# Patient Record
Sex: Male | Born: 1980 | Race: Black or African American | Hispanic: No | Marital: Married | Smoking: Never smoker
Health system: Southern US, Community
[De-identification: ages and names within clinical notes are randomized; demographics above are authoritative.]

## PROBLEM LIST (undated history)

## (undated) DIAGNOSIS — M109 Gout, unspecified: Secondary | ICD-10-CM

## (undated) DIAGNOSIS — I10 Essential (primary) hypertension: Secondary | ICD-10-CM

## (undated) DIAGNOSIS — E119 Type 2 diabetes mellitus without complications: Secondary | ICD-10-CM

---

## 1998-09-21 ENCOUNTER — Encounter: Payer: Self-pay | Admitting: Family Medicine

## 1998-09-21 ENCOUNTER — Ambulatory Visit (HOSPITAL_COMMUNITY): Admission: RE | Admit: 1998-09-21 | Discharge: 1998-09-21 | Payer: Self-pay | Admitting: Family Medicine

## 2003-06-20 ENCOUNTER — Emergency Department (HOSPITAL_COMMUNITY): Admission: EM | Admit: 2003-06-20 | Discharge: 2003-06-20 | Payer: Self-pay

## 2003-06-20 ENCOUNTER — Encounter: Payer: Self-pay | Admitting: Emergency Medicine

## 2013-08-21 ENCOUNTER — Emergency Department: Payer: Self-pay | Admitting: Emergency Medicine

## 2013-08-21 LAB — COMPREHENSIVE METABOLIC PANEL
Albumin: 4.1 g/dL (ref 3.4–5.0)
Alkaline Phosphatase: 162 U/L — ABNORMAL HIGH
Anion Gap: 5 — ABNORMAL LOW (ref 7–16)
BUN: 9 mg/dL (ref 7–18)
Bilirubin,Total: 1.5 mg/dL — ABNORMAL HIGH (ref 0.2–1.0)
Calcium, Total: 10.3 mg/dL — ABNORMAL HIGH (ref 8.5–10.1)
Chloride: 97 mmol/L — ABNORMAL LOW (ref 98–107)
Co2: 30 mmol/L (ref 21–32)
Creatinine: 1.2 mg/dL (ref 0.60–1.30)
EGFR (African American): >60
EGFR (Non-African Amer.): >60
Glucose: 489 mg/dL — ABNORMAL HIGH (ref 65–99)
Osmolality: 285 (ref 275–301)
Potassium: 4 mmol/L (ref 3.5–5.1)
SGOT(AST): 90 U/L — ABNORMAL HIGH (ref 15–37)
SGPT (ALT): 208 U/L — ABNORMAL HIGH (ref 12–78)
Sodium: 132 mmol/L — ABNORMAL LOW (ref 136–145)
Total Protein: 8.3 g/dL — ABNORMAL HIGH (ref 6.4–8.2)

## 2013-08-21 LAB — URINALYSIS, COMPLETE
Bacteria: NONE SEEN
Bilirubin,UR: NEGATIVE
Glucose,UR: >=500 mg/dL (ref 0–75)
Leukocyte Esterase: NEGATIVE
Nitrite: NEGATIVE
Ph: 6 (ref 4.5–8.0)
Protein: NEGATIVE
Specific Gravity: 1.032 (ref 1.003–1.030)
Squamous Epithelial: NONE SEEN

## 2013-08-21 LAB — CBC
HCT: 51.2 % (ref 40.0–52.0)
HGB: 17.9 g/dL (ref 13.0–18.0)
MCH: 31.1 pg (ref 26.0–34.0)
MCHC: 34.9 g/dL (ref 32.0–36.0)
MCV: 89 fL (ref 80–100)
Platelet: 209 10*3/uL (ref 150–440)
RBC: 5.76 10*6/uL (ref 4.40–5.90)
RDW: 12.9 % (ref 11.5–14.5)
WBC: 5.6 10*3/uL (ref 3.8–10.6)

## 2016-12-21 ENCOUNTER — Emergency Department (HOSPITAL_COMMUNITY)
Admission: EM | Admit: 2016-12-21 | Discharge: 2016-12-21 | Disposition: A | Discharge status: Home / Self Care | Payer: Self-pay | Attending: Emergency Medicine | Admitting: Emergency Medicine

## 2016-12-21 ENCOUNTER — Emergency Department (HOSPITAL_COMMUNITY): Payer: Self-pay

## 2016-12-21 ENCOUNTER — Encounter (HOSPITAL_COMMUNITY): Payer: Self-pay | Admitting: *Deleted

## 2016-12-21 DIAGNOSIS — I10 Essential (primary) hypertension: Secondary | ICD-10-CM | POA: Insufficient documentation

## 2016-12-21 DIAGNOSIS — R0789 Other chest pain: Secondary | ICD-10-CM | POA: Insufficient documentation

## 2016-12-21 DIAGNOSIS — M25472 Effusion, left ankle: Secondary | ICD-10-CM | POA: Insufficient documentation

## 2016-12-21 DIAGNOSIS — E119 Type 2 diabetes mellitus without complications: Secondary | ICD-10-CM | POA: Insufficient documentation

## 2016-12-21 HISTORY — DX: Type 2 diabetes mellitus without complications: E11.9

## 2016-12-21 HISTORY — DX: Gout, unspecified: M10.9

## 2016-12-21 HISTORY — DX: Essential (primary) hypertension: I10

## 2016-12-21 LAB — BASIC METABOLIC PANEL
ANION GAP: 7 (ref 5–15)
BUN: 9 mg/dL (ref 6–20)
CO2: 28 mmol/L (ref 22–32)
Calcium: 9.6 mg/dL (ref 8.9–10.3)
Chloride: 102 mmol/L (ref 101–111)
Creatinine, Ser: 0.98 mg/dL (ref 0.61–1.24)
GFR calc Af Amer: >60 mL/min (ref >60)
Glucose, Bld: 97 mg/dL (ref 65–99)
POTASSIUM: 4.1 mmol/L (ref 3.5–5.1)
SODIUM: 137 mmol/L (ref 135–145)

## 2016-12-21 LAB — CBC
HEMATOCRIT: 45.7 % (ref 39.0–52.0)
HEMOGLOBIN: 16 g/dL (ref 13.0–17.0)
MCH: 30 pg (ref 26.0–34.0)
MCHC: 35 g/dL (ref 30.0–36.0)
MCV: 85.7 fL (ref 78.0–100.0)
Platelets: 218 10*3/uL (ref 150–400)
RBC: 5.33 MIL/uL (ref 4.22–5.81)
RDW: 12.8 % (ref 11.5–15.5)
WBC: 5.9 10*3/uL (ref 4.0–10.5)

## 2016-12-21 LAB — I-STAT TROPONIN, ED: Troponin i, poc: 0 ng/mL (ref 0.00–0.08)

## 2016-12-21 LAB — D-DIMER, QUANTITATIVE (NOT AT ARMC): D DIMER QUANT: 0.32 ug{FEU}/mL (ref 0.00–0.50)

## 2016-12-21 MED ORDER — NAPROXEN 375 MG PO TABS
375.0000 mg | ORAL_TABLET | Freq: Two times a day (BID) | ORAL | 0 refills | Status: DC
Start: 2016-12-21 — End: 2019-04-10

## 2016-12-21 MED ORDER — KETOROLAC TROMETHAMINE 30 MG/ML IJ SOLN
30.0000 mg | Freq: Once | INTRAMUSCULAR | Status: AC
  Administered 2016-12-21: 30 mg via INTRAVENOUS
  Filled 2016-12-21: qty 1

## 2016-12-21 NOTE — ED Provider Notes (Signed)
[facility]-EMERGENCY DEPT Provider Note   CSN: [id number] Arrival date & time: 12/21/16  1505     History   Chief Complaint Chief Complaint  Patient presents with  . Back Pain  . Chest Pain    HPI Eddie Baxter is a 36 y.o. male.  Patient is a 36 year old male with a history of hypertension and diabetes presenting today with sudden onset of right-sided chest and back pain that has gradually worsened as the day has gone on. The pain is worse with movement, deep breathing and bending. He woke up this morning feeling fine and symptoms started after his first delivery. He does not recall lifting anything heavy or feeling a catch in his side before the pain started.  He initially went to PCP who sent him here for further evaluation because he had an abnormal EKG. Patient does note he's had pain and swelling in his left ankle for the last 1 week which he thought was gout. He denies any immobilization, long trips, surgery or calf tenderness. No significant family history of DVT.  He denies any recent illness, cough or congestion. He does not suffer from allergies and is not a smoker.   The history is provided by the patient.  Back Pain   This is a new problem. The problem occurs constantly. The problem has been gradually worsening. The pain is associated with no known injury. The pain is present in the thoracic spine. The pain does not radiate. The pain is at a severity of 8/10. Associated symptoms include chest pain.  Chest Pain   Associated symptoms include back pain.    Past Medical History:  Diagnosis Date  . Diabetes mellitus without complication (HCC)   . Gout   . Hypertension     There are no active problems to display for this patient.   History reviewed. No pertinent surgical history.     Home Medications    Prior to Admission medications   Not on File    Family History History reviewed. No pertinent family history.  Social History Social History  Substance Use  Topics  . Smoking status: Never Smoker  . Smokeless tobacco: Not on file  . Alcohol use No     Allergies   Patient has no known allergies.   Review of Systems Review of Systems  Cardiovascular: Positive for chest pain.  Musculoskeletal: Positive for back pain.  All other systems reviewed and are negative.    Physical Exam Updated Vital Signs BP (!) 142/93 (BP Location: Right Arm)   Pulse 79   Temp 97.8 F (36.6 C) (Oral)   Resp 16   SpO2 99%   Physical Exam  Constitutional: He is oriented to person, place, and time. He appears well-developed and well-nourished. No distress.  HENT:  Head: Normocephalic and atraumatic.  Mouth/Throat: Oropharynx is clear and moist.  Eyes: Conjunctivae and EOM are normal. Pupils are equal, round, and reactive to light.  Neck: Normal range of motion. Neck supple.  Cardiovascular: Normal rate, regular rhythm and intact distal pulses.   No murmur heard. Pulmonary/Chest: Effort normal and breath sounds normal. No respiratory distress. He has no wheezes. He has no rales.     He exhibits tenderness.  Minimal tenderness with chest palpation  Abdominal: Soft. He exhibits no distension. There is no tenderness. There is no rebound and no guarding.  Musculoskeletal: Normal range of motion. He exhibits no edema or tenderness.  Mild swelling of the left ankle and warmth but no erythema  Neurological: He is alert and oriented to person, place, and time.  Skin: Skin is warm and dry. No rash noted. No erythema.  Psychiatric: He has a normal mood and affect. His behavior is normal.  Nursing note and vitals reviewed.    ED Treatments / Results  Labs (all labs ordered are listed, but only abnormal results are displayed) Labs Reviewed  BASIC METABOLIC PANEL  CBC  D-DIMER, QUANTITATIVE (NOT AT [facility])  I-STAT TROPOININ, ED    EKG  EKG Interpretation  Date/Time:  Friday December 21 2016 15:19:15 EDT Ventricular Rate:  87 PR Interval:  154 QRS  Duration: 86 QT Interval:  366 QTC Calculation: 440 R Axis:   42 Text Interpretation:  Normal sinus rhythm Nonspecific T wave abnormality No previous tracing Confirmed by Anitra Lauth  MD, Briauna Gilmartin ([id number]) on 12/21/2016 7:03:56 PM       Radiology Dg Chest 2 View  Result Date: 12/21/2016 CLINICAL DATA:  Back pain for 1 day EXAM: CHEST  2 VIEW COMPARISON:  None. FINDINGS: The heart size and mediastinal contours are within normal limits. Both lungs are clear. The visualized skeletal structures are unremarkable. IMPRESSION: No active cardiopulmonary disease. Electronically Signed   By: Alcide Clever M.D.   On: 12/21/2016 15:55    Procedures Procedures (including critical care time)  Medications Ordered in ED Medications  ketorolac (TORADOL) 30 MG/ML injection 30 mg (not administered)     Initial Impression / Assessment and Plan / ED Course  I have reviewed the triage vital signs and the nursing notes.  Pertinent labs & imaging results that were available during my care of the patient were reviewed by me and considered in my medical decision making (see chart for details).     Patient presenting today with pleuritic type chest pain started this morning and has gradually worsened. Initially saw PCP and was sent here for further evaluation.  Patient does not have a rash concerning for shingles at this time however could be early zoster that has not developed a rash yet. Patient is low risk on the Wells criteria but has had some swelling in his left ankle which is most likely gout but will do a d-dimer. Chest x-ray within normal limits without any evidence of pneumothorax. He has no infectious symptoms concerning for pneumonia.  EKG has nonspecific abnormalities such as T-wave inversion but may be related to chronic hypertension.  Troponin is within normal limits and patient has had pain for greater than 6 hours and story is very atypical for ACS. Low suspicion for dissection and patient denies any  abdominal issues. Could be musculoskeletal. It is worse with palpation and movement. 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 CBC, BMP and troponin are negative. D-dimer pending and patient given Toradol  8:27 PM Dimer is neg will treat for MSK pain.  F/u with PCP if not improved in a week or rash develops  Final Clinical Impressions(s) / ED Diagnoses   Final diagnoses:  Chest wall pain    New Prescriptions New Prescriptions   NAPROXEN (NAPROSYN) 375 MG TABLET    Take 1 tablet (375 mg total) by mouth 2 (two) times daily.     Gwyneth Sprout, MD 12/21/16 [phone]

## 2016-12-21 NOTE — ED Triage Notes (Signed)
Pt reports being sent here from pcp office for right side back pain that radiates around to his front. Pain increases with movement and deep breathing. Denies recent cough. pcp did EKG that she reported was abnormal, sent here for cardiac workup. No acute distress is noted at triage.

## 2018-04-08 IMAGING — CR DG CHEST 2V
2 series · 2 of 2 positions shown · non-contrast
Comparison: None.

CLINICAL DATA: Back pain for 1 day

EXAM:
CHEST  2 VIEW

[chest pa]
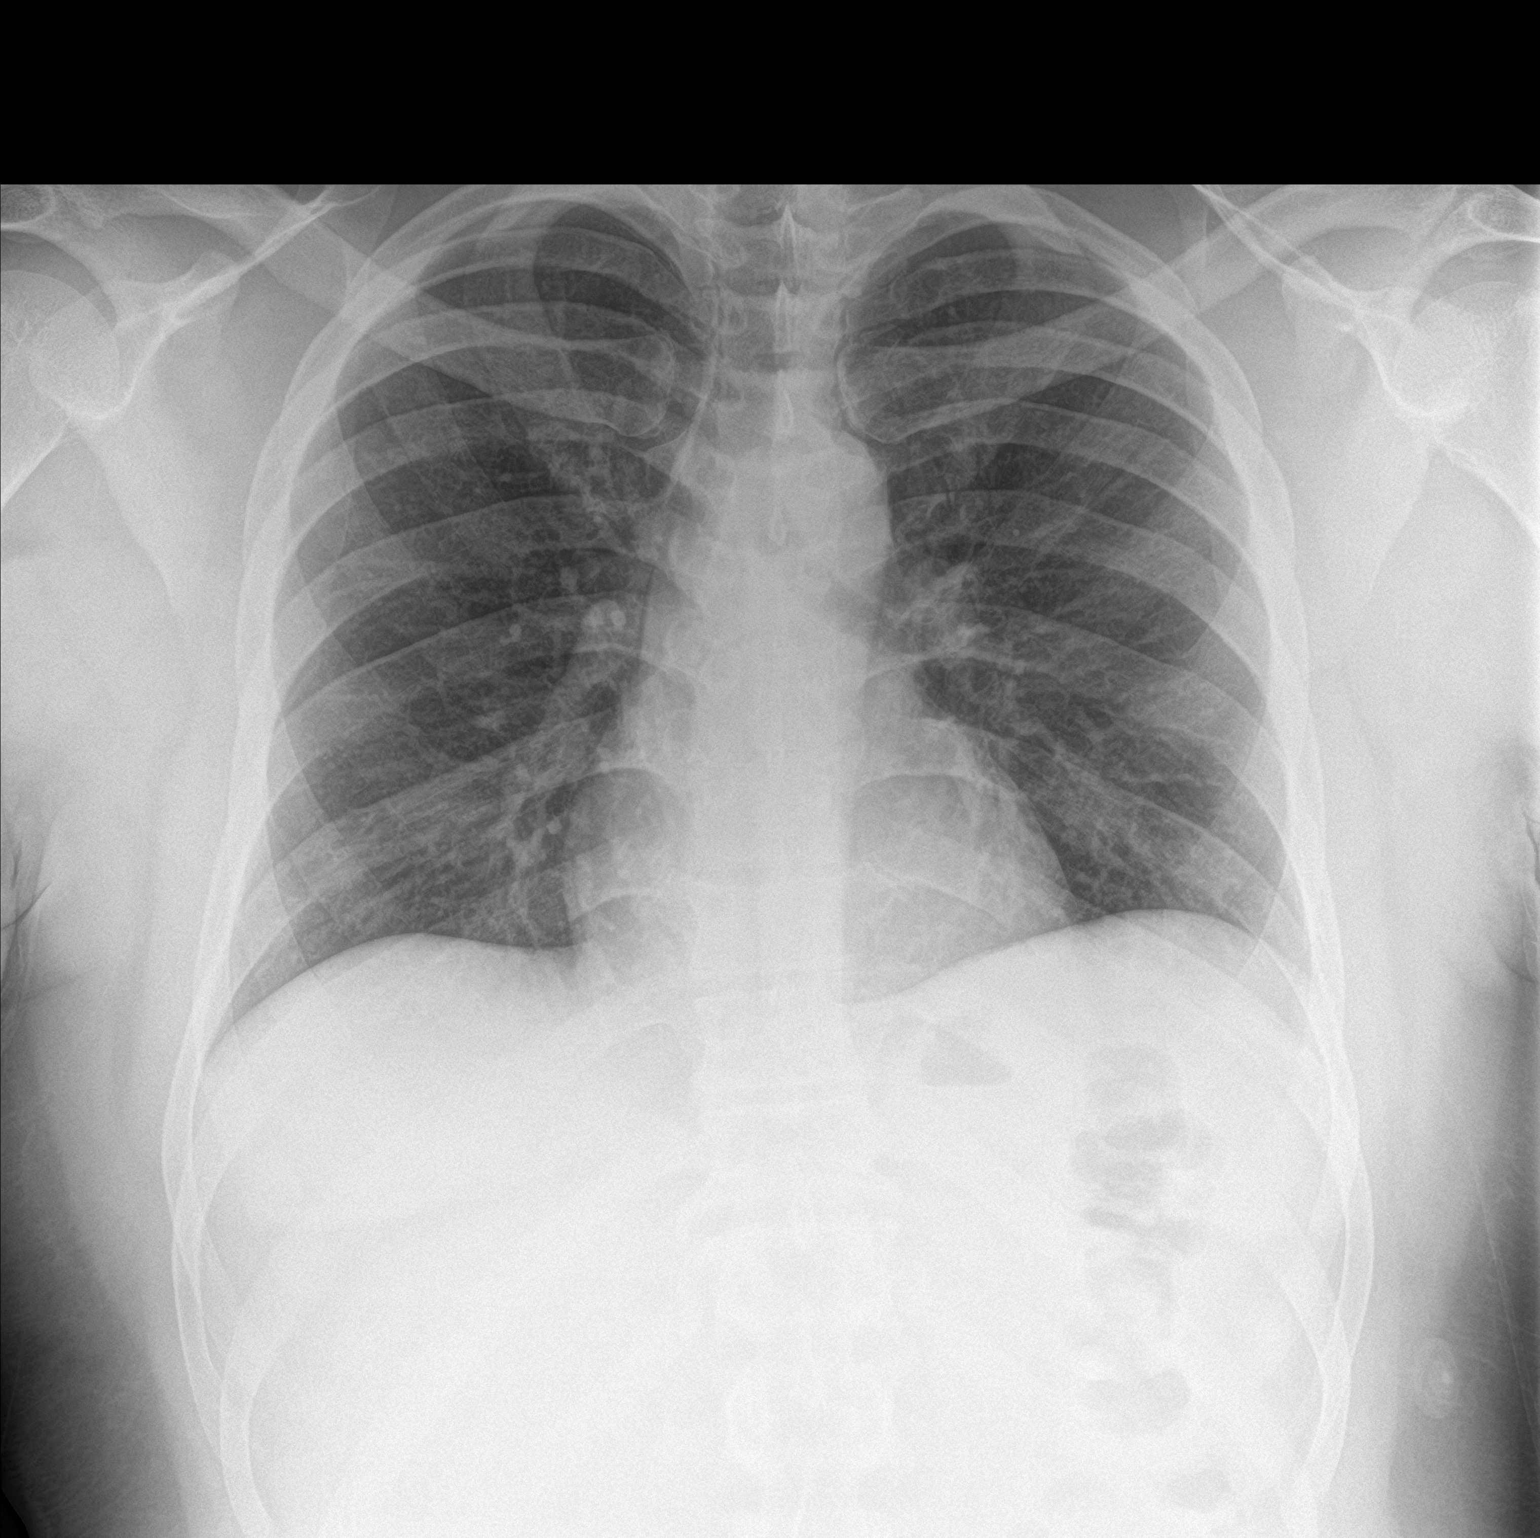

[chest lat]
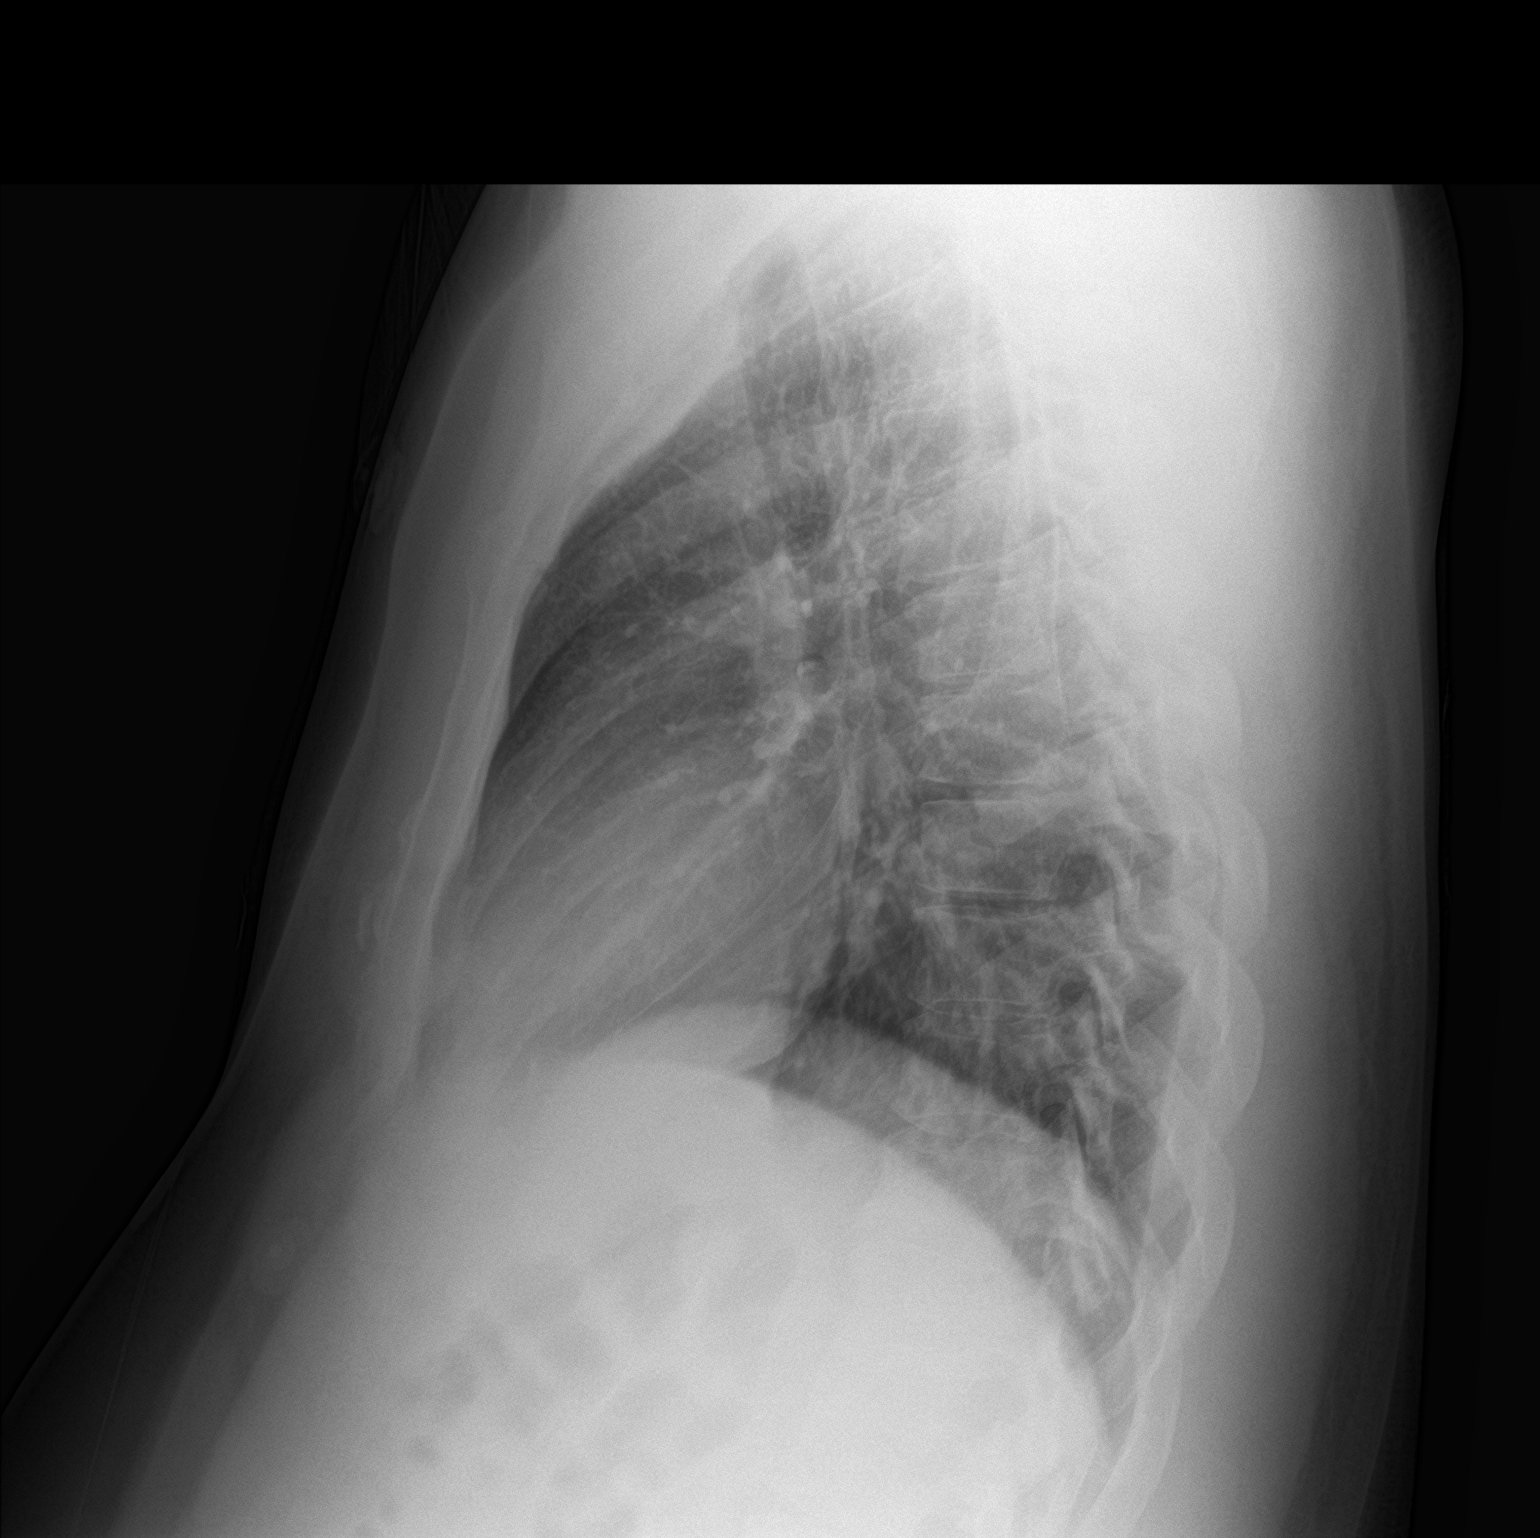

[2 of 2 positions shown; findings below may reference images not displayed]

FINDINGS: The heart size and mediastinal contours are within normal limits.
Both lungs are clear. The visualized skeletal structures are
unremarkable.
IMPRESSION: No active cardiopulmonary disease.

## 2019-03-09 ENCOUNTER — Emergency Department (HOSPITAL_COMMUNITY)
Admission: EM | Admit: 2019-03-09 | Discharge: 2019-03-09 | Discharge status: Against Medical Advice | Payer: BC Managed Care – PPO

## 2019-03-09 ENCOUNTER — Encounter (HOSPITAL_COMMUNITY): Payer: Self-pay | Admitting: *Deleted

## 2019-03-09 ENCOUNTER — Emergency Department (HOSPITAL_COMMUNITY)
Admission: EM | Admit: 2019-03-09 | Discharge: 2019-03-10 | Disposition: A | Discharge status: Home / Self Care | Payer: BC Managed Care – PPO | Attending: Emergency Medicine | Admitting: Emergency Medicine

## 2019-03-09 ENCOUNTER — Emergency Department (HOSPITAL_COMMUNITY): Payer: BC Managed Care – PPO

## 2019-03-09 DIAGNOSIS — J029 Acute pharyngitis, unspecified: Secondary | ICD-10-CM | POA: Diagnosis not present

## 2019-03-09 DIAGNOSIS — E119 Type 2 diabetes mellitus without complications: Secondary | ICD-10-CM | POA: Diagnosis not present

## 2019-03-09 DIAGNOSIS — I10 Essential (primary) hypertension: Secondary | ICD-10-CM | POA: Diagnosis not present

## 2019-03-09 DIAGNOSIS — Z20822 Contact with and (suspected) exposure to covid-19: Secondary | ICD-10-CM

## 2019-03-09 DIAGNOSIS — R0602 Shortness of breath: Secondary | ICD-10-CM | POA: Insufficient documentation

## 2019-03-09 DIAGNOSIS — Z20828 Contact with and (suspected) exposure to other viral communicable diseases: Secondary | ICD-10-CM | POA: Diagnosis not present

## 2019-03-09 NOTE — ED Notes (Signed)
No answer for triage.

## 2019-03-09 NOTE — ED Notes (Signed)
Called x5 for triage no reply.

## 2019-03-09 NOTE — ED Triage Notes (Signed)
To ED for further eval of sob and sore throat. States he was seen at [facility] today and sent here for tachycardia. No cp. Tested for strep at [facility] and sent her for covid test. Told at [facility] his strep test was neg.

## 2019-03-10 ENCOUNTER — Other Ambulatory Visit: Payer: Self-pay

## 2019-03-10 MED ORDER — ACETAMINOPHEN 500 MG PO TABS
1000.0000 mg | ORAL_TABLET | Freq: Once | ORAL | Status: AC
  Administered 2019-03-10: 01:00:00 1000 mg via ORAL
  Filled 2019-03-10: qty 2

## 2019-03-10 NOTE — ED Provider Notes (Signed)
[facility] EMERGENCY DEPARTMENT Provider Note  CSN: [id number] Arrival date & time: 03/09/19 1756  Chief Complaint(s) Shortness of Breath and Sore Throat  HPI Eddie Baxter is a 38 y.o. male   The history is provided by the patient.  Shortness of Breath Severity:  Mild Onset quality:  Gradual Duration:  1 day Timing:  Intermittent Progression:  Waxing and waning Chronicity:  New Context: URI   Relieved by:  Nothing Worsened by:  Coughing Associated symptoms: cough   Associated symptoms: no fever, no sputum production, no vomiting and no wheezing   Sore Throat Associated symptoms include shortness of breath.   UCC rapid strep negative. Sent here for further eval of possible COVID. No known exposures per patient.  Past Medical History Past Medical History:  Diagnosis Date  . Diabetes mellitus without complication (HCC)    diet controlled  . Gout   . Hypertension    There are no active problems to display for this patient.  Home Medication(s) Prior to Admission medications   Medication Sig Start Date End Date Taking? Authorizing Provider  naproxen (NAPROSYN) 375 MG tablet Take 1 tablet (375 mg total) by mouth 2 (two) times daily. 12/21/16   Blanchie Dessert, MD                                                                                                                                    Past Surgical History History reviewed. No pertinent surgical history. Family History No family history on file.  Social History Social History   Tobacco Use  . Smoking status: Never Smoker  Substance Use Topics  . Alcohol use: No  . Drug use: Not on file   Allergies Patient has no known allergies.  Review of Systems Review of Systems  Constitutional: Negative for fever.  Respiratory: Positive for cough and shortness of breath. Negative for sputum production and wheezing.   Gastrointestinal: Negative for vomiting.   All other systems are  reviewed and are negative for acute change except as noted in the HPI  Physical Exam Vital Signs  I have reviewed the triage vital signs BP (!) 155/117 (BP Location: Left Arm)   Pulse (!) 104   Temp 99.2 F (37.3 C) (Oral)   Resp 18   SpO2 99%   Physical Exam Vitals signs reviewed.  Constitutional:      General: He is not in acute distress.    Appearance: He is well-developed. He is not diaphoretic.  HENT:     Head: Normocephalic and atraumatic.     Nose: Nose normal.     Mouth/Throat:     Pharynx: Posterior oropharyngeal erythema present. No oropharyngeal exudate.     Tonsils: No tonsillar exudate.     Comments: Post nasal drip Eyes:     General: No scleral icterus.       Right eye: No discharge.  Left eye: No discharge.     Conjunctiva/sclera: Conjunctivae normal.     Pupils: Pupils are equal, round, and reactive to light.  Neck:     Musculoskeletal: Normal range of motion and neck supple.  Cardiovascular:     Rate and Rhythm: Normal rate and regular rhythm.     Heart sounds: No murmur. No friction rub. No gallop.   Pulmonary:     Effort: Pulmonary effort is normal. No respiratory distress.     Breath sounds: Normal breath sounds. No stridor. No rales.  Abdominal:     General: There is no distension.     Palpations: Abdomen is soft.     Tenderness: There is no abdominal tenderness.  Musculoskeletal:        General: No tenderness.  Skin:    General: Skin is warm and dry.     Findings: No erythema or rash.  Neurological:     Mental Status: He is alert and oriented to person, place, and time.     ED Results and Treatments Labs (all labs ordered are listed, but only abnormal results are displayed) Labs Reviewed  NOVEL CORONAVIRUS, NAA [facility](HOSPITAL ORDER, SEND-OUT TO REF LAB)                                                                                                                         EKG  EKG Interpretation  Date/Time:  Monday March 09 2019  18:11:52 EDT Ventricular Rate:  107 PR Interval:  154 QRS Duration: 90 QT Interval:  334 QTC Calculation: 445 R Axis:   73 Text Interpretation:  Sinus tachycardia Inferior infarct , age undetermined ST & T wave abnormality, consider anterolateral ischemia Abnormal ECG NO STEMI. Confirmed by Drema Pryardama,  [phone]([id number]) on 03/10/2019 12:18:46 AM      Radiology Dg Chest 2 View  Result Date: 03/09/2019 CLINICAL DATA:  SOB, tachycardia, sore throat starting this morning. No fever. Hx of diabetes, hypertension. Nonsmoker. EXAM: CHEST - 2 VIEW COMPARISON:  Chest x-ray dated 12/21/2016. FINDINGS: The heart size and mediastinal contours are within normal limits. Both lungs are clear. The visualized skeletal structures are unremarkable. IMPRESSION: No active cardiopulmonary disease. No evidence of pneumonia or pulmonary edema. Electronically Signed   By: Bary RichardStan  Maynard M.D.   On: 03/09/2019 19:24    Pertinent labs & imaging results that were available during my care of the patient were reviewed by me and considered in my medical decision making (see chart for details).  Medications Ordered in ED Medications  acetaminophen (TYLENOL) tablet 1,000 mg (has no administration in time range)  Procedures Procedures  (including critical care time)  Medical Decision Making / ED Course I have reviewed the nursing notes for this encounter and the patient's prior records (if available in EHR or on provided paperwork).   Eddie Baxter was evaluated in Emergency Department on 03/10/2019 for the symptoms described in the history of present illness. He was evaluated in the context of the global COVID-19 pandemic, which necessitated consideration that the patient might be at risk for infection with the SARS-CoV-2 virus that causes COVID-19. Institutional protocols and algorithms that  pertain to the evaluation of patients at risk for COVID-19 are in a state of rapid change based on information released by regulatory bodies including the CDC and federal and state organizations. These policies and algorithms were followed during the patient's care in the ED.  38 y.o. male presents with viral symptoms for 1 day. adequate oral hydration. Rest of history as above.  Patient appears well. No signs of toxicity, patient is interactive. No hypoxia, tachypnea or other signs of respiratory distress. No sign of clinical dehydration. Lung exam clear. Rest of exam as above.  Most consistent with viral illness   No evidence suggestive of pharyngitis, AOM, PNA, or meningitis.  Chest x-ray without evidence suggestive of pneumonia, pneumothorax, pneumomediastinum.  No abnormal contour of the mediastinum to suggest dissection. No evidence of acute injuries. EKG nonischemic.  Patient evaluated, tested and sent home with instructions for home care and Quarantine. Instructed to seek further care if symptoms worsen.  Is set up with follow up for a virtual visit with PCP in next 24-48 hours.  Discussed symptomatic treatment with the patient and they will follow closely with their PCP.        Final Clinical Impression(s) / ED Diagnoses Final diagnoses:  Sore throat  Shortness of breath  Suspected Covid-19 Virus Infection     The patient appears reasonably screened and/or stabilized for discharge and I doubt any other medical condition or other [facility] requiring further screening, evaluation, or treatment in the ED at this time prior to discharge.  Disposition: Discharge  Condition: Good  I have discussed the results, Dx and Tx plan with the patient who expressed understanding and agree(s) with the plan. Discharge instructions discussed at great length. The patient was given strict return precautions who verbalized understanding of the instructions. No further questions at time of discharge.     ED Discharge Orders    None        This chart was dictated using voice recognition software.  Despite best efforts to proofread,  errors can occur which can change the documentation meaning.   Nira Connardama,  Eduardo, MD 03/10/19 [phone]

## 2019-03-10 NOTE — Discharge Instructions (Addendum)
You may take over-the-counter medicine for symptomatic relief, such as Tylenol,TheraFlu, Alka seltzer , black elderberry, etc. Please limit acetaminophen (Tylenol) to 4000 mg and Ibuprofen (Motrin, Advil, etc.) to 2400 mg for a 24hr period. Please note that other over-the-counter medicine may contain acetaminophen or ibuprofen as a component of their ingredients.

## 2019-03-13 LAB — NOVEL CORONAVIRUS, NAA (HOSP ORDER, SEND-OUT TO REF LAB; TAT 18-24 HRS): SARS-CoV-2, NAA: NOT DETECTED

## 2019-04-10 ENCOUNTER — Other Ambulatory Visit: Payer: Self-pay

## 2019-04-10 ENCOUNTER — Emergency Department
Admission: EM | Admit: 2019-04-10 | Discharge: 2019-04-10 | Disposition: A | Discharge status: Home / Self Care | Payer: BC Managed Care – PPO | Attending: Emergency Medicine | Admitting: Emergency Medicine

## 2019-04-10 DIAGNOSIS — Z7984 Long term (current) use of oral hypoglycemic drugs: Secondary | ICD-10-CM | POA: Diagnosis not present

## 2019-04-10 DIAGNOSIS — I10 Essential (primary) hypertension: Secondary | ICD-10-CM | POA: Diagnosis not present

## 2019-04-10 DIAGNOSIS — R944 Abnormal results of kidney function studies: Secondary | ICD-10-CM | POA: Diagnosis present

## 2019-04-10 DIAGNOSIS — E119 Type 2 diabetes mellitus without complications: Secondary | ICD-10-CM | POA: Diagnosis not present

## 2019-04-10 DIAGNOSIS — N289 Disorder of kidney and ureter, unspecified: Secondary | ICD-10-CM | POA: Insufficient documentation

## 2019-04-10 LAB — BASIC METABOLIC PANEL
Anion gap: 9 (ref 5–15)
BUN: 58 mg/dL — ABNORMAL HIGH (ref 6–20)
CO2: 29 mmol/L (ref 22–32)
Calcium: 8.9 mg/dL (ref 8.9–10.3)
Chloride: 100 mmol/L (ref 98–111)
Creatinine, Ser: 2.3 mg/dL — ABNORMAL HIGH (ref 0.61–1.24)
GFR calc Af Amer: 40 mL/min — ABNORMAL LOW (ref >60)
GFR calc non Af Amer: 34 mL/min — ABNORMAL LOW (ref >60)
Glucose, Bld: 156 mg/dL — ABNORMAL HIGH (ref 70–99)
Potassium: 3.7 mmol/L (ref 3.5–5.1)
Sodium: 138 mmol/L (ref 135–145)

## 2019-04-10 LAB — COMPREHENSIVE METABOLIC PANEL
ALT: 118 U/L — ABNORMAL HIGH (ref 0–44)
AST: 60 U/L — ABNORMAL HIGH (ref 15–41)
Albumin: 4.1 g/dL (ref 3.5–5.0)
Alkaline Phosphatase: 86 U/L (ref 38–126)
Anion gap: 13 (ref 5–15)
BUN: 63 mg/dL — ABNORMAL HIGH (ref 6–20)
CO2: 25 mmol/L (ref 22–32)
Calcium: 9.4 mg/dL (ref 8.9–10.3)
Chloride: 98 mmol/L (ref 98–111)
Creatinine, Ser: 2.46 mg/dL — ABNORMAL HIGH (ref 0.61–1.24)
GFR calc Af Amer: 37 mL/min — ABNORMAL LOW (ref >60)
GFR calc non Af Amer: 32 mL/min — ABNORMAL LOW (ref >60)
Glucose, Bld: 163 mg/dL — ABNORMAL HIGH (ref 70–99)
Potassium: 3.1 mmol/L — ABNORMAL LOW (ref 3.5–5.1)
Sodium: 136 mmol/L (ref 135–145)
Total Bilirubin: 1.2 mg/dL (ref 0.3–1.2)
Total Protein: 7.9 g/dL (ref 6.5–8.1)

## 2019-04-10 LAB — CBC
HCT: 43.9 % (ref 39.0–52.0)
Hemoglobin: 15.5 g/dL (ref 13.0–17.0)
MCH: 30.1 pg (ref 26.0–34.0)
MCHC: 35.3 g/dL (ref 30.0–36.0)
MCV: 85.2 fL (ref 80.0–100.0)
Platelets: 199 10*3/uL (ref 150–400)
RBC: 5.15 MIL/uL (ref 4.22–5.81)
RDW: 11.6 % (ref 11.5–15.5)
WBC: 5.3 10*3/uL (ref 4.0–10.5)
nRBC: 0 % (ref 0.0–0.2)

## 2019-04-10 LAB — GLUCOSE, CAPILLARY: Glucose-Capillary: 164 mg/dL — ABNORMAL HIGH (ref 70–99)

## 2019-04-10 MED ORDER — METOCLOPRAMIDE HCL 10 MG PO TABS: 10.0000 mg | ORAL_TABLET | Freq: Three times a day (TID) | ORAL | 1 refills | Status: AC | PRN

## 2019-04-10 MED ORDER — ONDANSETRON HCL 4 MG/2ML IJ SOLN
4.0000 mg | Freq: Once | INTRAMUSCULAR | Status: AC
  Administered 2019-04-10: 4 mg via INTRAVENOUS
  Filled 2019-04-10: qty 2

## 2019-04-10 MED ORDER — SODIUM CHLORIDE 0.9 % IV SOLN
Freq: Once | INTRAVENOUS | Status: AC
  Administered 2019-04-10: 14:00:00 via INTRAVENOUS

## 2019-04-10 NOTE — ED Notes (Signed)
Pt a&o x4 and NAD noted. Pt states throwing up x2 weeks after going on metformin. Went to PCP today to check lab levels and was sent here because his kidneys are failing. No fevers, n&v, or other symptoms noted.

## 2019-04-10 NOTE — ED Triage Notes (Addendum)
Reports elevated BUN and creatinine at PCP yesterday, told to come to ER. Elevated glucose over last month and was recently started back on his diabetes regimen which he was off of for the last year. Denies NVD. Pt alert and oriented X4, active, cooperative, pt in NAD. RR even and unlabored, color WNL.    BUN 65, creatinine over 3 at PCP yesterday. Normal BUN and creatinine 03/25/19

## 2019-04-10 NOTE — ED Provider Notes (Signed)
[facility] Emergency Department Provider Note       Time seen: ----------------------------------------- 11:55 AM on 04/10/2019 -----------------------------------------   I have reviewed the triage vital signs and the nursing notes.  HISTORY   Chief Complaint Abnormal Lab    HPI Eddie Baxter is a 38 y.o. male with a history of diabetes, gout and hypertension who presents to the ED for abnormal renal function test.  Patient reports elevated BUN and creatinine at his primary care doctor yesterday.  He has had elevated blood sugar over the last month and recently started back on his diabetes medication after being off of them for the past year.  He has had vomiting every other day for some time.  Patient states he feels like the pressure builds up in his epigastrium when he vomits up fluids.  Past Medical History:  Diagnosis Date  . Diabetes mellitus without complication (HCC)    diet controlled  . Gout   . Hypertension     There are no active problems to display for this patient.   History reviewed. No pertinent surgical history.  Allergies Patient has no known allergies.  Social History Social History   Tobacco Use  . Smoking status: Never Smoker  Substance Use Topics  . Alcohol use: No  . Drug use: Not on file   Review of Systems Constitutional: Negative for fever. Cardiovascular: Negative for chest pain. Respiratory: Negative for shortness of breath. Gastrointestinal: Negative for abdominal pain, positive for vomiting Musculoskeletal: Negative for back pain. Skin: Negative for rash. Neurological: Negative for headaches, focal weakness or numbness.  All systems negative/normal/unremarkable except as stated in the HPI  ____________________________________________   PHYSICAL EXAM:  VITAL SIGNS: ED Triage Vitals [04/10/19 1103]  Enc Vitals Group     BP (!) 146/81     Pulse Rate (!) 104     Resp 18     Temp 98.3 F (36.8  C)     Temp Source Oral     SpO2 97 %     Weight 280 lb (127 kg)     Height 6\' 3"  (1.905 m)     Head Circumference      Peak Flow      Pain Score 0     Pain Loc      Pain Edu?      Excl. in [place]?    Constitutional: Alert and oriented. Well appearing and in no distress. Eyes: Conjunctivae are normal. Normal extraocular movements. Cardiovascular: Normal rate, regular rhythm. No murmurs, rubs, or gallops. Respiratory: Normal respiratory effort without tachypnea nor retractions. Breath sounds are clear and equal bilaterally. No wheezes/rales/rhonchi. Gastrointestinal: Soft and nontender. Normal bowel sounds Musculoskeletal: Nontender with normal range of motion in extremities. No lower extremity tenderness nor edema. Neurologic:  Normal speech and language. No gross focal neurologic deficits are appreciated.  Skin:  Skin is warm, dry and intact. No rash noted. Psychiatric: Mood and affect are normal. Speech and behavior are normal.  ___________________________________________  ED COURSE:  As part of my medical decision making, I reviewed the following data within the [place] History obtained from family if available, nursing notes, old chart and ekg, as well as notes from prior ED visits. Patient presented for abnormal renal function tests and vomiting, we will assess with labs and imaging as indicated at this time.   Procedures  ZYIER DYKEMA was evaluated in Emergency Department on 04/10/2019 for the symptoms described in the history of present  illness. He was evaluated in the context of the global COVID-19 pandemic, which necessitated consideration that the patient might be at risk for infection with the SARS-CoV-2 virus that causes COVID-19. Institutional protocols and algorithms that pertain to the evaluation of patients at risk for COVID-19 are in a state of rapid change based on information released by regulatory bodies including the CDC and federal and state  organizations. These policies and algorithms were followed during the patient's care in the ED.  ____________________________________________   LABS (pertinent positives/negatives)  Labs Reviewed  GLUCOSE, CAPILLARY - Abnormal; Notable for the following components:      Result Value   Glucose-Capillary 164 (*)    All other components within normal limits  COMPREHENSIVE METABOLIC PANEL - Abnormal; Notable for the following components:   Potassium 3.1 (*)    Glucose, Bld 163 (*)    BUN 63 (*)    Creatinine, Ser 2.46 (*)    AST 60 (*)    ALT 118 (*)    GFR calc non Af Amer 32 (*)    GFR calc Af Amer 37 (*)    All other components within normal limits  BASIC METABOLIC PANEL - Abnormal; Notable for the following components:   Glucose, Bld 156 (*)    BUN 58 (*)    Creatinine, Ser 2.30 (*)    GFR calc non Af Amer 34 (*)    GFR calc Af Amer 40 (*)    All other components within normal limits  CBC  CBG MONITORING, ED   ____________________________________________   DIFFERENTIAL DIAGNOSIS   Dehydration, electrolyte abnormality, renal failure, hyperglycemia, medication noncompliance, gastroparesis  FINAL ASSESSMENT AND PLAN  Renal insufficiency   Plan: The patient had presented for abnormal renal function tests. Patient's labs did indicate renal insufficiency however this improved after 2 liters.  I have discussed with nephrology, we will hold his metformin and advise close outpatient follow-up with his doctor.   Ulice DashJohnathan E Alyssah Algeo, MD    Note: This note was generated in part or whole with voice recognition software. Voice recognition is usually quite accurate but there are transcription errors that can and very often do occur. I apologize for any typographical errors that were not detected and corrected.     Emily FilbertWilliams, Ariana Juul E, MD 04/10/19 1515

## 2020-06-24 IMAGING — CR CHEST - 2 VIEW
2 series · 2 of 2 positions shown · non-contrast
Comparison: Chest x-ray dated 12/21/2016.

CLINICAL DATA: SOB, tachycardia, sore throat starting this morning.
No fever. Hx of diabetes, hypertension. Nonsmoker.

EXAM:
CHEST - 2 VIEW

[chest pa]
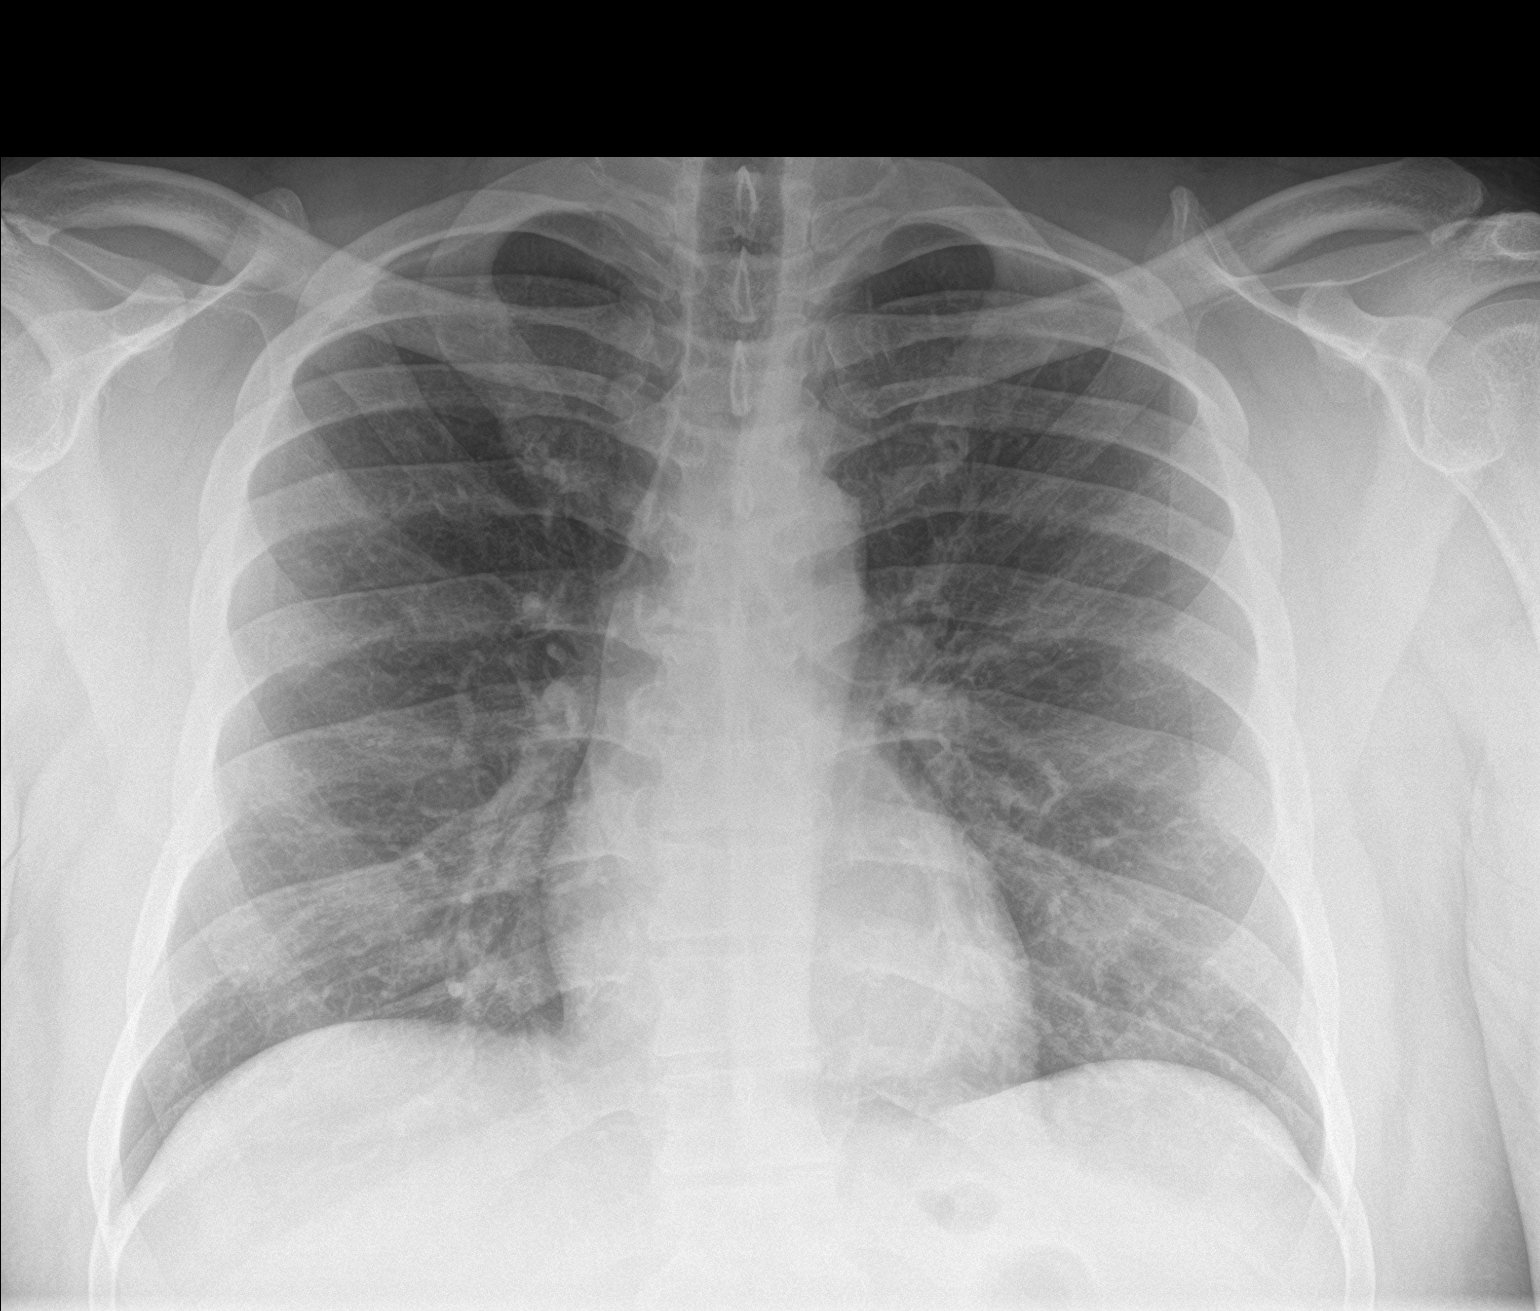

[chest lat]
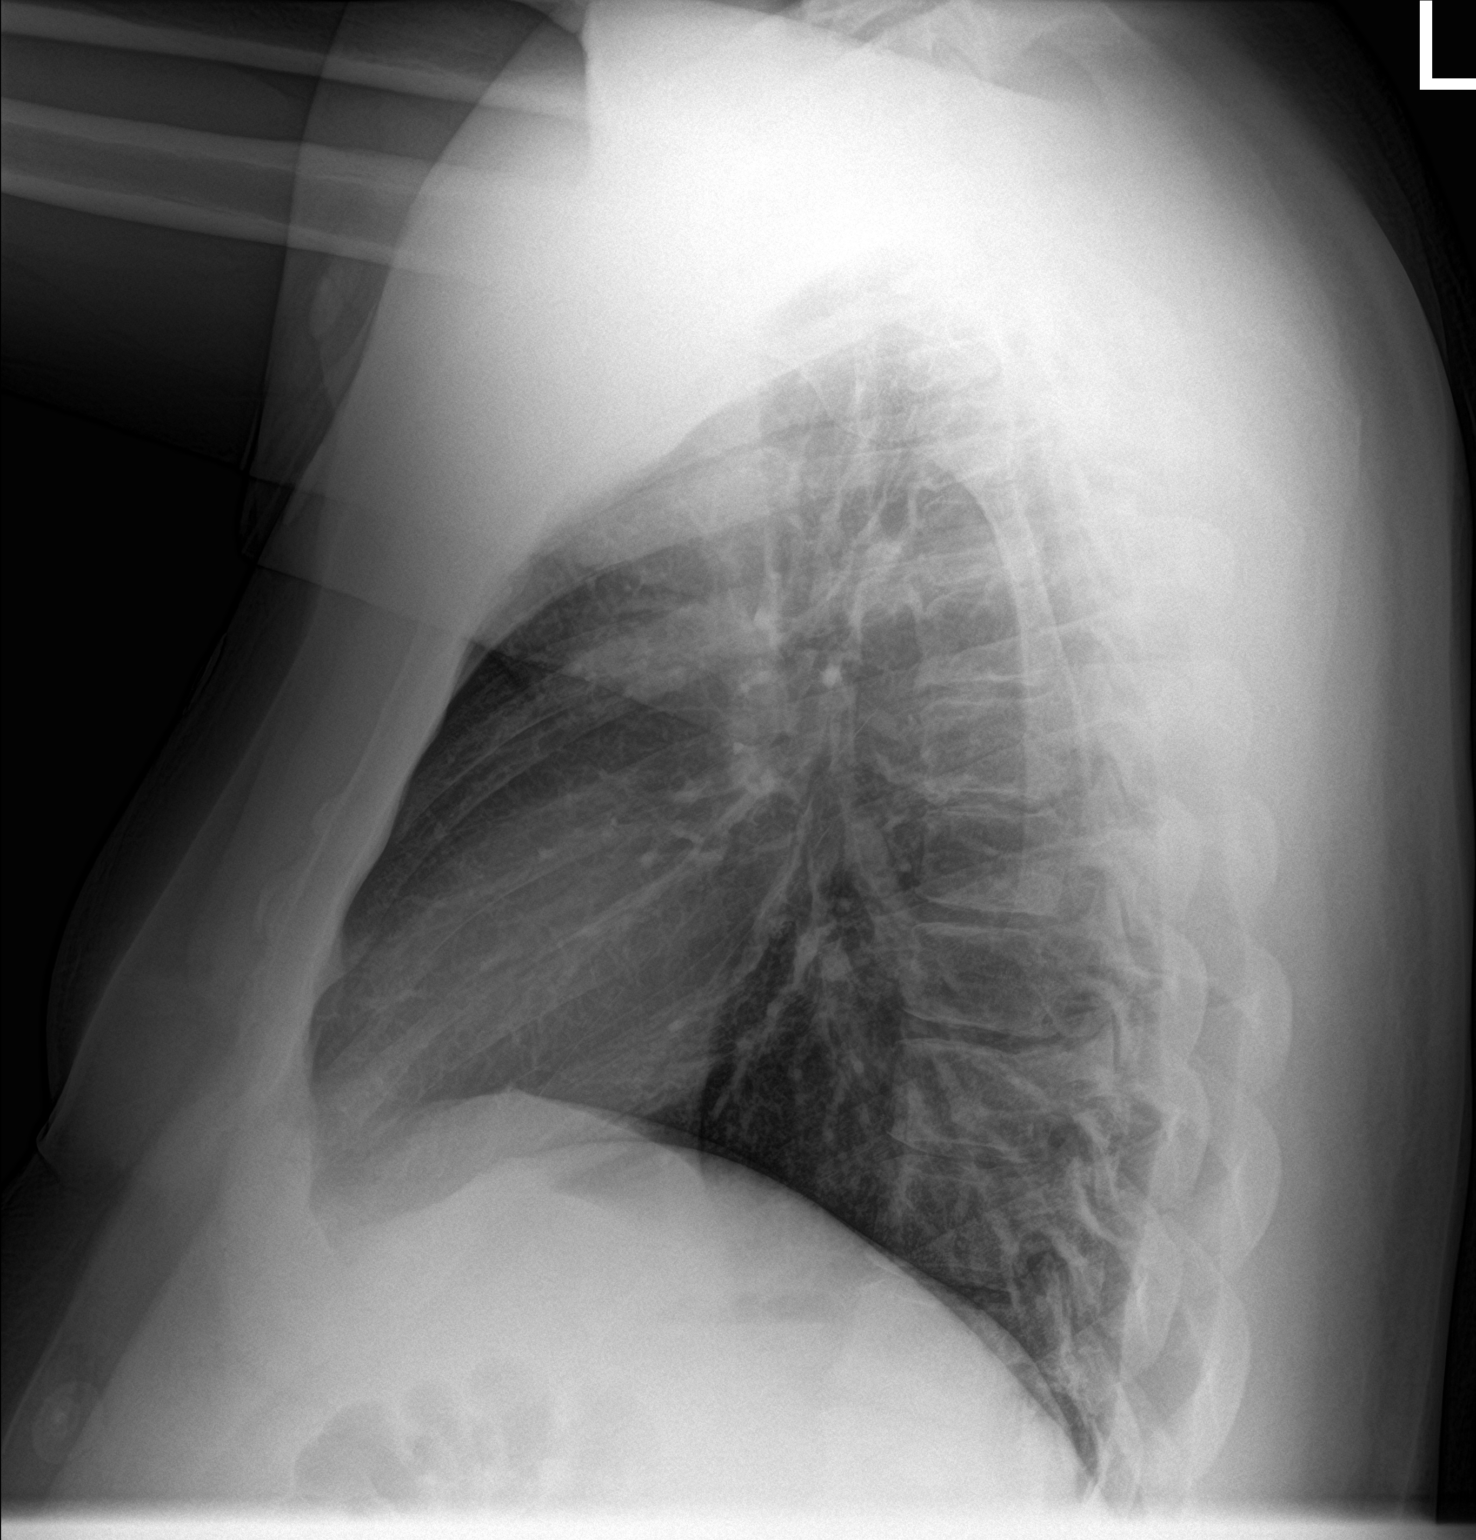

[2 of 2 positions shown; findings below may reference images not displayed]

FINDINGS: The heart size and mediastinal contours are within normal limits.
Both lungs are clear. The visualized skeletal structures are
unremarkable.
IMPRESSION: No active cardiopulmonary disease. No evidence of pneumonia or
pulmonary edema.

## 2022-03-09 ENCOUNTER — Encounter (HOSPITAL_COMMUNITY): Payer: Self-pay | Admitting: Pediatrics

## 2022-03-09 ENCOUNTER — Other Ambulatory Visit: Payer: Self-pay

## 2022-03-09 ENCOUNTER — Emergency Department (HOSPITAL_COMMUNITY): Payer: BC Managed Care – PPO

## 2022-03-09 ENCOUNTER — Emergency Department (HOSPITAL_COMMUNITY)
Admission: EM | Admit: 2022-03-09 | Discharge: 2022-03-09 | Disposition: A | Discharge status: Home / Self Care | Payer: BC Managed Care – PPO | Attending: Emergency Medicine | Admitting: Emergency Medicine

## 2022-03-09 DIAGNOSIS — M546 Pain in thoracic spine: Secondary | ICD-10-CM | POA: Diagnosis not present

## 2022-03-09 DIAGNOSIS — Y92410 Unspecified street and highway as the place of occurrence of the external cause: Secondary | ICD-10-CM | POA: Diagnosis not present

## 2022-03-09 DIAGNOSIS — S199XXA Unspecified injury of neck, initial encounter: Secondary | ICD-10-CM | POA: Diagnosis present

## 2022-03-09 DIAGNOSIS — S161XXA Strain of muscle, fascia and tendon at neck level, initial encounter: Secondary | ICD-10-CM | POA: Insufficient documentation

## 2022-03-09 MED ORDER — METHOCARBAMOL 500 MG PO TABS: 500.0000 mg | ORAL_TABLET | Freq: Two times a day (BID) | ORAL | 0 refills | Status: AC | PRN

## 2022-03-09 MED ORDER — IBUPROFEN 800 MG PO TABS: 800.0000 mg | ORAL_TABLET | Freq: Three times a day (TID) | ORAL | 0 refills | Status: AC
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
## 2022-03-09 NOTE — ED Notes (Signed)
Discharge instructions reviewed with patient. Patient verbalized understanding of instructions. Follow-up care and medications were reviewed. Patient ambulatory with steady gait. VSS upon discharge.  ?
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
## 2022-03-09 NOTE — ED Notes (Signed)
Patient transported to CT 
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
## 2022-03-28 ENCOUNTER — Ambulatory Visit (INDEPENDENT_AMBULATORY_CARE_PROVIDER_SITE_OTHER): Payer: BC Managed Care – PPO | Admitting: Physical Therapy

## 2022-03-28 ENCOUNTER — Encounter: Payer: Self-pay | Admitting: Physical Therapy

## 2022-03-28 ENCOUNTER — Other Ambulatory Visit: Payer: Self-pay

## 2022-03-28 DIAGNOSIS — M546 Pain in thoracic spine: Secondary | ICD-10-CM | POA: Diagnosis not present

## 2022-03-30 ENCOUNTER — Ambulatory Visit (INDEPENDENT_AMBULATORY_CARE_PROVIDER_SITE_OTHER): Payer: BC Managed Care – PPO | Admitting: Rehabilitative and Restorative Service Providers"

## 2022-03-30 ENCOUNTER — Encounter: Payer: Self-pay | Admitting: Rehabilitative and Restorative Service Providers"

## 2022-03-30 DIAGNOSIS — M546 Pain in thoracic spine: Secondary | ICD-10-CM | POA: Diagnosis not present
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
## 2022-04-10 ENCOUNTER — Encounter: Payer: Self-pay | Admitting: Rehabilitative and Restorative Service Providers"

## 2022-04-10 ENCOUNTER — Ambulatory Visit (INDEPENDENT_AMBULATORY_CARE_PROVIDER_SITE_OTHER): Payer: BC Managed Care – PPO | Admitting: Rehabilitative and Restorative Service Providers"

## 2022-04-10 DIAGNOSIS — M546 Pain in thoracic spine: Secondary | ICD-10-CM
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
## 2022-04-11 ENCOUNTER — Ambulatory Visit (INDEPENDENT_AMBULATORY_CARE_PROVIDER_SITE_OTHER): Payer: BC Managed Care – PPO | Admitting: Podiatry

## 2022-04-11 ENCOUNTER — Encounter: Payer: Self-pay | Admitting: Podiatry

## 2022-04-11 DIAGNOSIS — E119 Type 2 diabetes mellitus without complications: Secondary | ICD-10-CM

## 2022-04-11 NOTE — Progress Notes (Signed)
  Subjective:  Patient ID: Eddie Baxter, male    DOB: 07/03/1981,   MRN: [id number]  Chief Complaint  Patient presents with   Diabetes Mellitus    Diabetic foot exam - Patient states he either cuts his nails or goes to the nails salon A1C 11.6 FBS- 158 PCP - 04/10/22    41 y.o. male presents for concern of diabetic foot check. Denies any pain in his feet.  Denies burning and tingling in their feet. Patient is diabetic and last A1c was 11.6  PCP:  Quita Skye. PA   . Denies any other pedal complaints. Denies n/v/f/c.   Past Medical History:  Diagnosis Date   Diabetes mellitus without complication (HCC)    diet controlled   Gout    Hypertension     Objective:  Physical Exam: Vascular: DP/PT pulses 2/4 bilateral. CFT <3 seconds. Normal hair growth on digits. No edema.  Skin. No lacerations or abrasions bilateral feet. Nails 1-5 bilateral normal in appearance. Some hyperkeratosis mild to medial right first metatarsal.  Musculoskeletal: MMT 5/5 bilateral lower extremities in DF, PF, Inversion and Eversion. Deceased ROM in DF of ankle joint.  Neurological: Sensation intact to light touch.   Assessment:   1. Type 2 diabetes mellitus without complication, unspecified whether long term insulin use (HCC)      Plan:  Patient was evaluated and treated and all questions answered. -Discussed and educated patient on diabetic foot care, especially with  regards to the vascular, neurological and musculoskeletal systems.  -Stressed the importance of good glycemic control and the detriment of not  controlling glucose levels in relation to the foot. -Discussed supportive shoes at all times and checking feet regularly.  -Answered all patient questions -Patient to return in one year DM foot check.  -Patient advised to call the office if any problems or questions arise in the meantime.   Louann Sjogren, DPM

## 2022-04-12 ENCOUNTER — Encounter: Payer: Self-pay | Admitting: Rehabilitative and Restorative Service Providers"

## 2022-04-12 ENCOUNTER — Ambulatory Visit (INDEPENDENT_AMBULATORY_CARE_PROVIDER_SITE_OTHER): Payer: BC Managed Care – PPO | Admitting: Rehabilitative and Restorative Service Providers"

## 2022-04-12 DIAGNOSIS — M546 Pain in thoracic spine: Secondary | ICD-10-CM | POA: Diagnosis not present
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
## 2022-04-16 ENCOUNTER — Ambulatory Visit (INDEPENDENT_AMBULATORY_CARE_PROVIDER_SITE_OTHER): Payer: BC Managed Care – PPO | Admitting: Physical Therapy

## 2022-04-16 ENCOUNTER — Encounter: Payer: Self-pay | Admitting: Physical Therapy

## 2022-04-16 DIAGNOSIS — M546 Pain in thoracic spine: Secondary | ICD-10-CM
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
## 2022-04-19 ENCOUNTER — Ambulatory Visit (INDEPENDENT_AMBULATORY_CARE_PROVIDER_SITE_OTHER): Payer: BC Managed Care – PPO | Admitting: Physical Therapy

## 2022-04-19 DIAGNOSIS — M546 Pain in thoracic spine: Secondary | ICD-10-CM | POA: Diagnosis not present
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
## 2022-04-24 ENCOUNTER — Encounter: Payer: Self-pay | Admitting: Physical Therapy

## 2022-04-24 ENCOUNTER — Ambulatory Visit (INDEPENDENT_AMBULATORY_CARE_PROVIDER_SITE_OTHER): Payer: BC Managed Care – PPO | Admitting: Physical Therapy

## 2022-04-24 DIAGNOSIS — M546 Pain in thoracic spine: Secondary | ICD-10-CM | POA: Diagnosis not present

## 2022-04-26 ENCOUNTER — Ambulatory Visit (INDEPENDENT_AMBULATORY_CARE_PROVIDER_SITE_OTHER): Payer: BC Managed Care – PPO | Admitting: Rehabilitative and Restorative Service Providers"

## 2022-04-26 ENCOUNTER — Encounter: Payer: Self-pay | Admitting: Rehabilitative and Restorative Service Providers"

## 2022-04-26 DIAGNOSIS — M546 Pain in thoracic spine: Secondary | ICD-10-CM
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
## 2022-05-02 ENCOUNTER — Encounter: Payer: BC Managed Care – PPO | Admitting: Rehabilitative and Restorative Service Providers"

## 2022-05-04 ENCOUNTER — Encounter: Payer: Self-pay | Admitting: Rehabilitative and Restorative Service Providers"

## 2022-05-04 ENCOUNTER — Ambulatory Visit (INDEPENDENT_AMBULATORY_CARE_PROVIDER_SITE_OTHER): Payer: BC Managed Care – PPO | Admitting: Rehabilitative and Restorative Service Providers"

## 2022-05-04 DIAGNOSIS — M546 Pain in thoracic spine: Secondary | ICD-10-CM | POA: Diagnosis not present
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
## 2022-05-23 ENCOUNTER — Encounter: Payer: Self-pay | Admitting: Physical Therapy

## 2022-05-23 ENCOUNTER — Ambulatory Visit (INDEPENDENT_AMBULATORY_CARE_PROVIDER_SITE_OTHER): Payer: BC Managed Care – PPO | Admitting: Physical Therapy

## 2022-05-23 DIAGNOSIS — M546 Pain in thoracic spine: Secondary | ICD-10-CM
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
## 2022-05-25 ENCOUNTER — Ambulatory Visit (INDEPENDENT_AMBULATORY_CARE_PROVIDER_SITE_OTHER): Payer: BC Managed Care – PPO | Admitting: Physical Therapy

## 2022-05-25 ENCOUNTER — Encounter: Payer: Self-pay | Admitting: Physical Therapy

## 2022-05-25 DIAGNOSIS — M546 Pain in thoracic spine: Secondary | ICD-10-CM
